# Patient Record
Sex: Female | Born: 1990 | Hispanic: Yes | Marital: Single | Smoking: Never smoker
Health system: Southern US, Community
[De-identification: ages and names within clinical notes are randomized; demographics above are authoritative.]

## PROBLEM LIST (undated history)

## (undated) DIAGNOSIS — Z789 Other specified health status: Secondary | ICD-10-CM

## (undated) HISTORY — PX: NO PAST SURGERIES: SHX2092

---

## 2020-03-25 ENCOUNTER — Encounter (HOSPITAL_COMMUNITY): Payer: Self-pay | Admitting: Obstetrics and Gynecology

## 2020-03-25 ENCOUNTER — Other Ambulatory Visit: Payer: Self-pay

## 2020-03-25 ENCOUNTER — Inpatient Hospital Stay (HOSPITAL_BASED_OUTPATIENT_CLINIC_OR_DEPARTMENT_OTHER): Payer: Medicaid - Out of State

## 2020-03-25 ENCOUNTER — Inpatient Hospital Stay (HOSPITAL_COMMUNITY)
Admission: AD | Admit: 2020-03-25 | Discharge: 2020-03-25 | Disposition: A | Discharge status: Home / Self Care | Payer: Medicaid - Out of State | Attending: Obstetrics and Gynecology | Admitting: Obstetrics and Gynecology

## 2020-03-25 DIAGNOSIS — Z3A16 16 weeks gestation of pregnancy: Secondary | ICD-10-CM

## 2020-03-25 DIAGNOSIS — O4692 Antepartum hemorrhage, unspecified, second trimester: Secondary | ICD-10-CM | POA: Diagnosis not present

## 2020-03-25 DIAGNOSIS — O26892 Other specified pregnancy related conditions, second trimester: Secondary | ICD-10-CM | POA: Insufficient documentation

## 2020-03-25 DIAGNOSIS — Z0372 Encounter for suspected placental problem ruled out: Secondary | ICD-10-CM

## 2020-03-25 DIAGNOSIS — R102 Pelvic and perineal pain: Secondary | ICD-10-CM | POA: Diagnosis not present

## 2020-03-25 DIAGNOSIS — O209 Hemorrhage in early pregnancy, unspecified: Secondary | ICD-10-CM | POA: Diagnosis present

## 2020-03-25 DIAGNOSIS — O208 Other hemorrhage in early pregnancy: Secondary | ICD-10-CM | POA: Diagnosis not present

## 2020-03-25 DIAGNOSIS — Z6790 Unspecified blood type, Rh positive: Secondary | ICD-10-CM | POA: Diagnosis not present

## 2020-03-25 HISTORY — DX: Other specified health status: Z78.9

## 2020-03-25 LAB — CBC
HCT: 33.3 % — ABNORMAL LOW (ref 36.0–46.0)
Hemoglobin: 11.2 g/dL — ABNORMAL LOW (ref 12.0–15.0)
MCH: 28.9 pg (ref 26.0–34.0)
MCHC: 33.6 g/dL (ref 30.0–36.0)
MCV: 85.8 fL (ref 80.0–100.0)
Platelets: 282 10*3/uL (ref 150–400)
RBC: 3.88 MIL/uL (ref 3.87–5.11)
RDW: 12.9 % (ref 11.5–15.5)
WBC: 9 10*3/uL (ref 4.0–10.5)
nRBC: 0 % (ref 0.0–0.2)

## 2020-03-25 LAB — URINALYSIS, ROUTINE W REFLEX MICROSCOPIC
Bilirubin Urine: NEGATIVE
Glucose, UA: NEGATIVE mg/dL
Ketones, ur: NEGATIVE mg/dL
Leukocytes,Ua: NEGATIVE
Nitrite: NEGATIVE
Protein, ur: NEGATIVE mg/dL
Specific Gravity, Urine: 1.01 (ref 1.005–1.030)
pH: 8 (ref 5.0–8.0)

## 2020-03-25 LAB — WET PREP, GENITAL
Clue Cells Wet Prep HPF POC: NONE SEEN
Sperm: NONE SEEN
Trich, Wet Prep: NONE SEEN
Yeast Wet Prep HPF POC: NONE SEEN

## 2020-03-25 LAB — POCT PREGNANCY, URINE: Preg Test, Ur: POSITIVE — AB

## 2020-03-25 LAB — ABO/RH: ABO/RH(D): A POS

## 2020-03-25 NOTE — Discharge Instructions (Signed)
Vaginal Bleeding During Pregnancy, Second Trimester  A small amount of bleeding (spotting) from the vagina is common during pregnancy. Sometimes the bleeding is normal and is not a sign of problems. In some other cases, it is a sign of something serious. Tell your doctor right away if there is any bleeding from your vagina. Follow these instructions at home: Activity  Follow your doctor's instructions about how active you can be.  If needed, make plans for someone to help with your normal activities.  Do not exercise or do activities that take a lot of effort until your doctor says that this is safe.  Do not lift anything that is heavier than 10 lb (4.5 kg) until your doctor says that this is safe.  Do not have sex or orgasms until your doctor says that this is safe. Medicines  Take over-the-counter and prescription medicines only as told by your doctor.  Do not take aspirin. It can cause bleeding. General instructions  Watch your condition for any changes.  Write down: ? The number of pads you use each day. ? How often you change pads. ? How soaked your pads are.  Do not use tampons.  Do not douche.  If you pass any tissue from your vagina, save it to show to your doctor.  Keep all follow-up visits as told by your doctor. This is important. Contact a doctor if:  You have bleeding in the vagina at any time during pregnancy.  You have cramps.  You have a fever that does not get better with medicine. Get help right away if:  You have very bad cramps in your back or belly (abdomen).  You have contractions.  You have chills.  You pass large clots or a lot of tissue from your vagina.  Your bleeding gets worse.  You feel light-headed.  You feel weak.  You pass out (faint).  You are leaking fluid from your vagina.  You have a gush of fluid from your vagina. Summary  Sometimes vaginal bleeding during pregnancy is normal and is not a problem. Sometimes it may  be a sign of something serious.  Tell your doctor about any bleeding from your vagina right away.  Follow your doctor's instructions about how active you can be. You may need someone to help you with your normal activities. This information is not intended to replace advice given to you by your health care provider. Make sure you discuss any questions you have with your health care provider. Document Revised: 07/06/2018 Document Reviewed: 06/18/2016 Elsevier Patient Education  2020 [employer]  

## 2020-03-25 NOTE — MAU Provider Note (Signed)
History     CSN: [id number]  Arrival date and time: 03/25/20 0901   Event Date/Time   First Provider Initiated Contact with Patient 03/25/20 1001      Chief Complaint  Patient presents with  . Pelvic Pain  . Vaginal Bleeding   29 y.o. [id number] @16 .2 wks presenting with VB. Reports onset around 4am and again at 7am. Describes as dark red when she wipes and some in the toilet. Reports onset of low abdominal cramping around 8pm last night. Rates pain 1/10. Has not tried any treatments. No recent sex. She is getting care in 3/10 and reports no complications. Had a normal [place] last month.   OB History    Gravida  3   Para  2   Term  2   Preterm      AB      Living  2     SAB      IAB      Ectopic      Multiple      Live Births              Past Medical History:  Diagnosis Date  . Medical history non-contributory     Past Surgical History:  Procedure Laterality Date  . NO PAST SURGERIES      Family History  Problem Relation Age of Onset  . Hypertension Mother   . Diabetes Mother     Social History   Tobacco Use  . Smoking status: Never Smoker  . Smokeless tobacco: Never Used  Substance Use Topics  . Alcohol use: Never  . Drug use: Never    Allergies: No Known Allergies  No medications prior to admission.    Review of Systems  Gastrointestinal: Positive for abdominal pain.  Genitourinary: Positive for vaginal bleeding.   Physical Exam   Blood pressure 117/82, pulse 94, temperature 98.2 F (36.8 C), temperature source Oral, resp. rate 16, height 4\' 11"  (1.499 m), weight 57.7 kg, SpO2 100 %.  Physical Exam Vitals and nursing note reviewed.  Constitutional:      General: She is not in acute distress.    Appearance: Normal appearance.  HENT:     Head: Normocephalic and atraumatic.  Cardiovascular:     Rate and Rhythm: Normal rate.  Pulmonary:     Effort: Pulmonary effort is normal. No respiratory distress.  Abdominal:     General:  There is no distension.     Palpations: Abdomen is soft.     Tenderness: There is no abdominal tenderness. There is no guarding or rebound.  Genitourinary:    Comments: External: no lesions or erythema Vagina: rugated, pink, moist, small amt dark red bloody discharge Cervix closed/thick  Musculoskeletal:        General: Normal range of motion.     Cervical back: Normal range of motion.  Skin:    General: Skin is warm and dry.  Neurological:     General: No focal deficit present.     Mental Status: She is alert and oriented to person, place, and time.  Psychiatric:        Mood and Affect: Mood normal.        Behavior: Behavior normal.   FHT by doppler 145  Results for orders placed or performed during the hospital encounter of 03/25/20 (from the past 24 hour(s))  Pregnancy, urine POC     Status: Abnormal   Collection Time: 03/25/20  9:26 AM  Result Value Ref Range  Preg Test, Ur POSITIVE (A) NEGATIVE  Urinalysis, Routine w reflex microscopic Urine, Clean Catch     Status: Abnormal   Collection Time: 03/25/20  9:40 AM  Result Value Ref Range   Color, Urine YELLOW YELLOW   APPearance HAZY (A) CLEAR   Specific Gravity, Urine 1.010 1.005 - 1.030   pH 8.0 5.0 - 8.0   Glucose, UA NEGATIVE NEGATIVE mg/dL   Hgb urine dipstick MODERATE (A) NEGATIVE   Bilirubin Urine NEGATIVE NEGATIVE   Ketones, ur NEGATIVE NEGATIVE mg/dL   Protein, ur NEGATIVE NEGATIVE mg/dL   Nitrite NEGATIVE NEGATIVE   Leukocytes,Ua NEGATIVE NEGATIVE   RBC / HPF 0-5 0 - 5 RBC/hpf   WBC, UA 0-5 0 - 5 WBC/hpf   Bacteria, UA RARE (A) NONE SEEN   Squamous Epithelial / LPF 0-5 0 - 5  CBC     Status: Abnormal   Collection Time: 03/25/20 10:06 AM  Result Value Ref Range   WBC 9.0 4.0 - 10.5 K/uL   RBC 3.88 3.87 - 5.11 MIL/uL   Hemoglobin 11.2 (L) 12.0 - 15.0 g/dL   HCT 50.0 (L) 93.8 - 18.2 %   MCV 85.8 80.0 - 100.0 fL   MCH 28.9 26.0 - 34.0 pg   MCHC 33.6 30.0 - 36.0 g/dL   RDW 99.3 71.6 - 96.7 %   Platelets  282 150 - 400 K/uL   nRBC 0.0 0.0 - 0.2 %  ABO/Rh     Status: None   Collection Time: 03/25/20 10:06 AM  Result Value Ref Range   ABO/RH(D)      A POS Performed at [facility], [address], [place], [place] [address]   Wet prep, genital     Status: Abnormal   Collection Time: 03/25/20 10:19 AM  Result Value Ref Range   Yeast Wet Prep HPF POC NONE SEEN NONE SEEN   Trich, Wet Prep NONE SEEN NONE SEEN   Clue Cells Wet Prep HPF POC NONE SEEN NONE SEEN   WBC, Wet Prep HPF POC MANY (A) NONE SEEN   Sperm NONE SEEN    [place]: prelim> SCH near placental edge in LUS  MAU Course  Procedures  MDM Labs and [place] ordered and reviewed. Discussed findings with pt. Bleeding precautions discussed. Stable for discharge home.   Assessment and Plan   1. [redacted] weeks gestation of pregnancy   2. Vaginal bleeding in pregnancy, second trimester   3. Blood type, Rh positive    Discharge home Follow up with OB provider in [place] next week as scheduled Pelvic rest Bleeding/SAB precautions  Allergies as of 03/25/2020   No Known Allergies     Medication List    You have not been prescribed any medications.    Donette Larry, CNM 03/25/2020, 11:42 AM

## 2020-03-25 NOTE — MAU Note (Signed)
Nemesis Rainwater is a 29 y.o. at [redacted]w[redacted]d here in MAU reporting: vaginal bleeding starting at 0400, states it was more than spotting but not very heavy. States she has seen a small amount on a pad, no blood clots. Denies recent IC. Having a little bit of pelvic pain.   Pt reports she started [facility] in [place] due to insurance and is going back to [place], states she has a doctors office there and has been approved for [facility].  Onset of complaint: today  Pain score: 1/10  Vitals:   03/25/20 0931 03/25/20 0949  BP: 113/69 117/82  Pulse: 87 94  Resp: 16   Temp: 98.2 F (36.8 C)   SpO2: 100%      FHT:145  Lab orders placed from triage: UA, UPT

## 2020-03-26 LAB — GC/CHLAMYDIA PROBE AMP (~~LOC~~) NOT AT ARMC
Chlamydia: NEGATIVE
Comment: NEGATIVE
Comment: NORMAL
Neisseria Gonorrhea: NEGATIVE

## 2021-06-26 IMAGING — US US MFM OB LIMITED
1 series · 14 of 28 positions shown · non-contrast
Comparison: none

[Series 1: us mfm ob limited · 76 acquisitions, 14 frames shown]
[im 3/76]
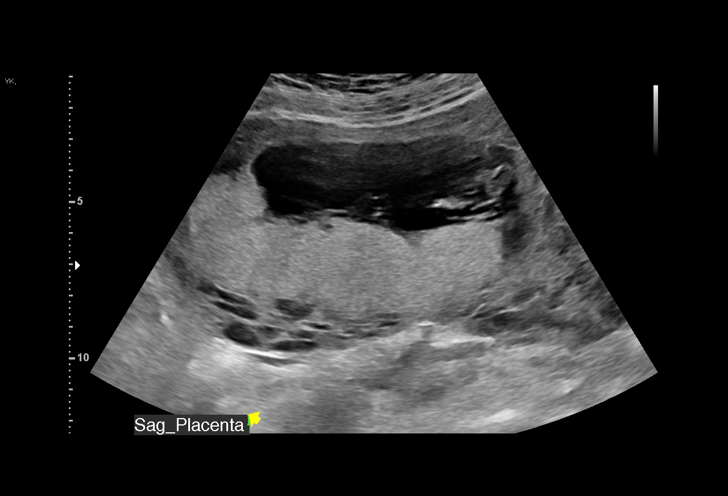
[im 9/76]
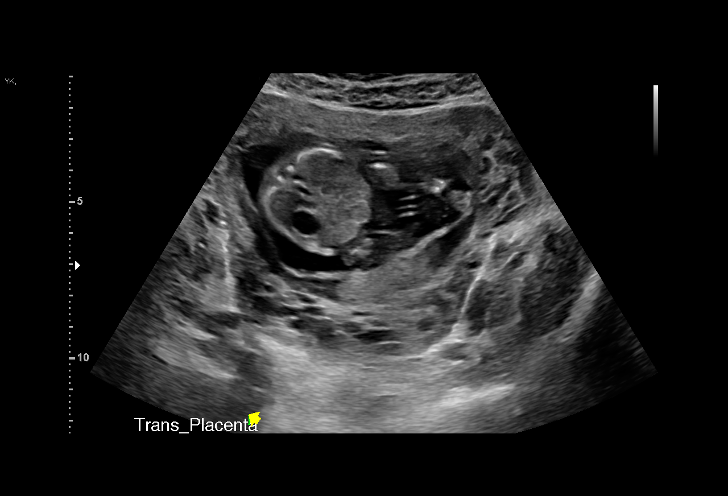
[im 14/76]
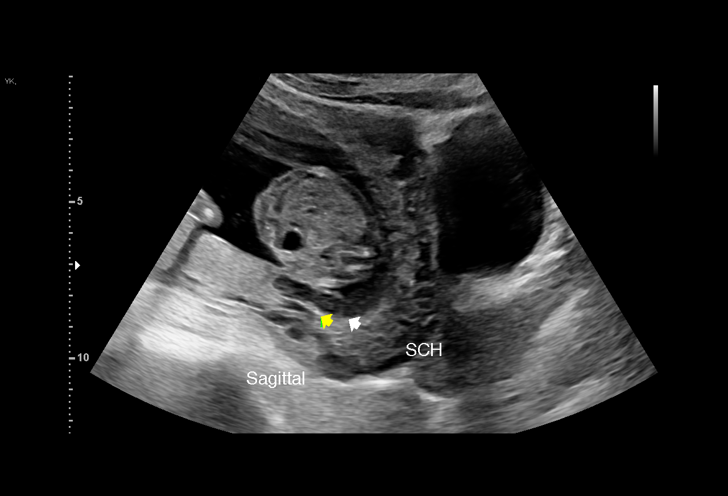
[im 20/76]
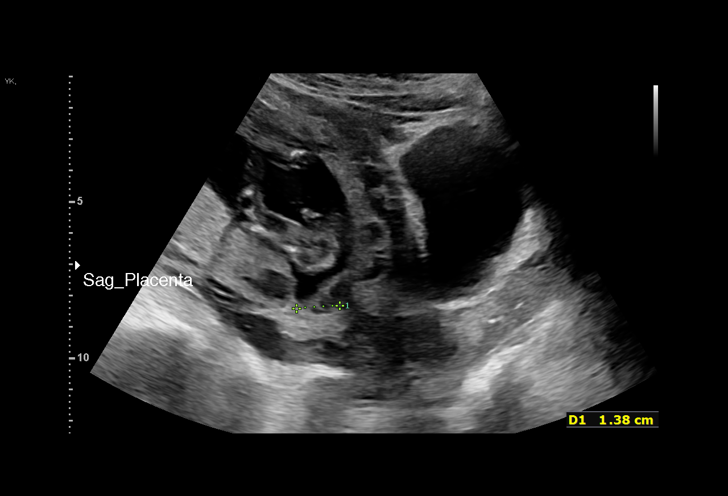
[im 26/76]
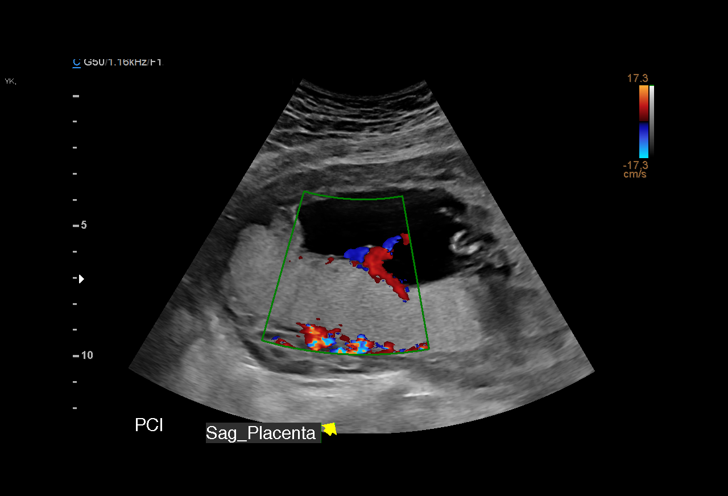
[im 31/76]
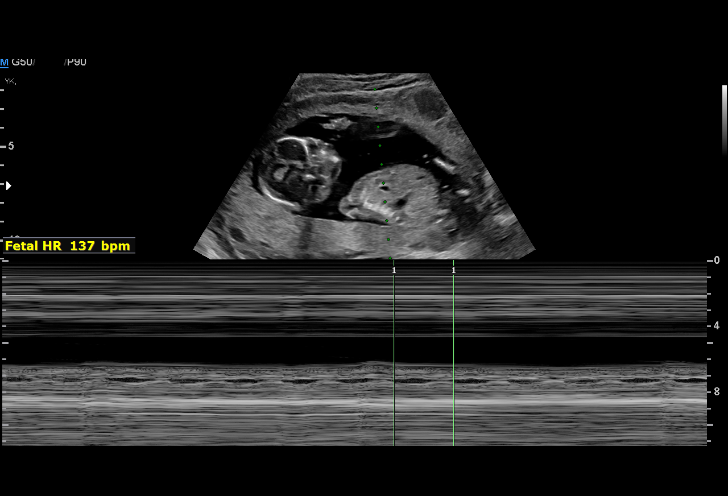
[im 37/76]
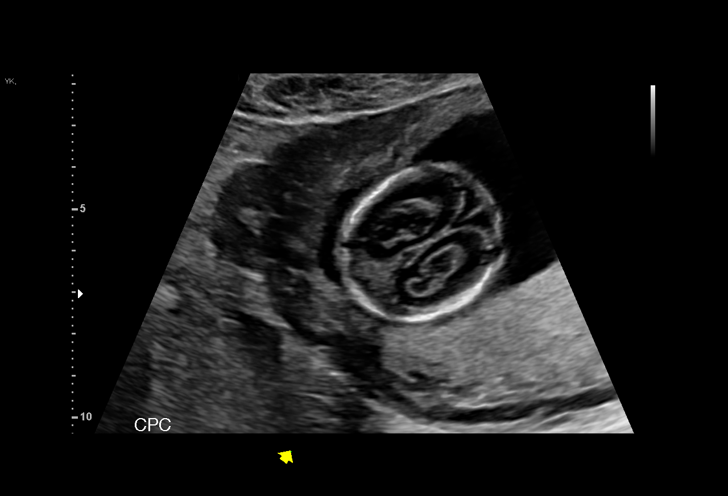
[im 42/76]
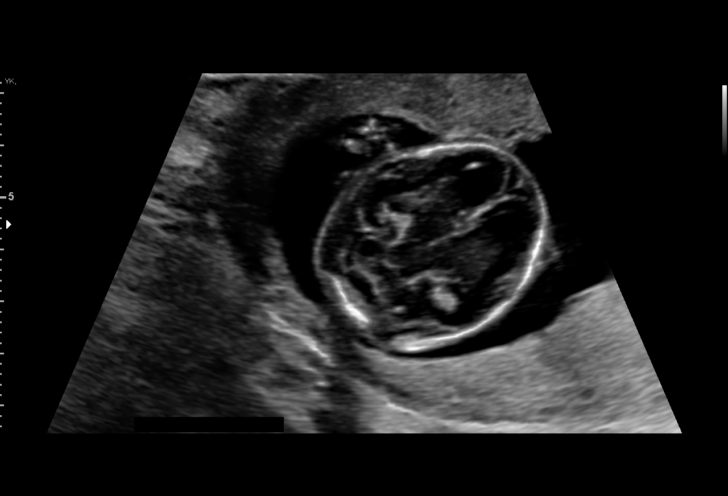
[im 48/76]
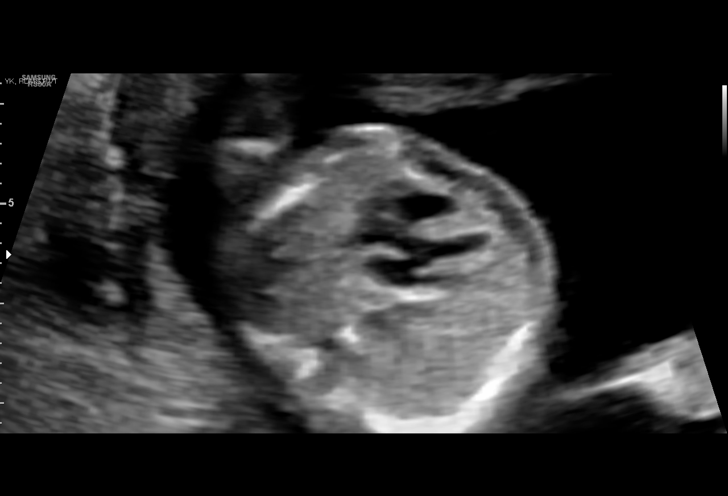
[im 53/76]
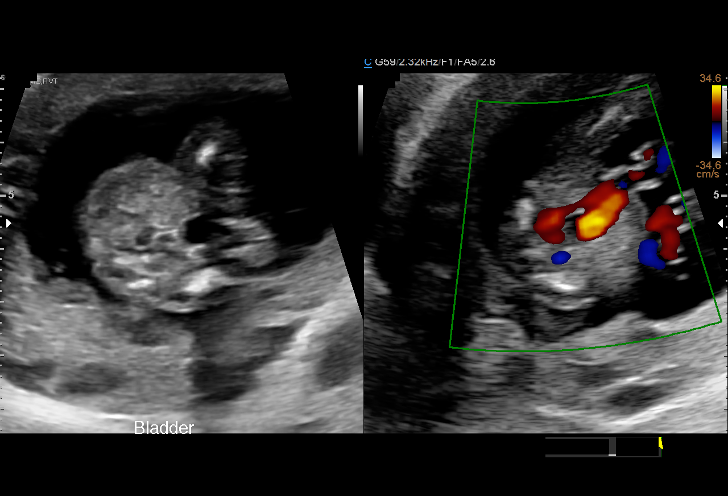
[im 59/76]
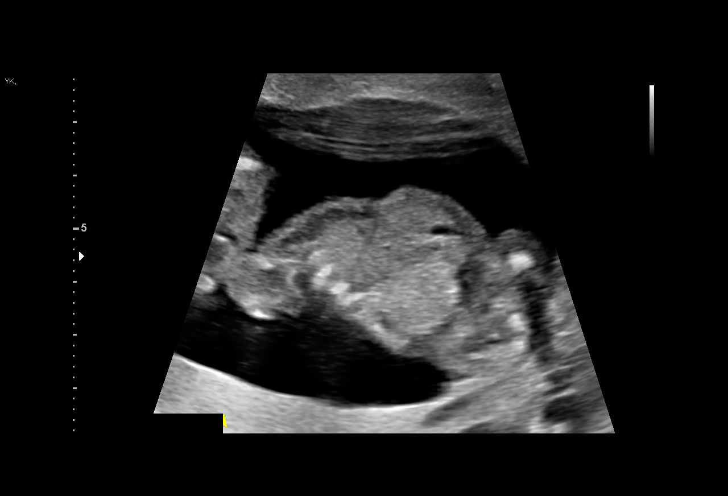
[im 64/76]
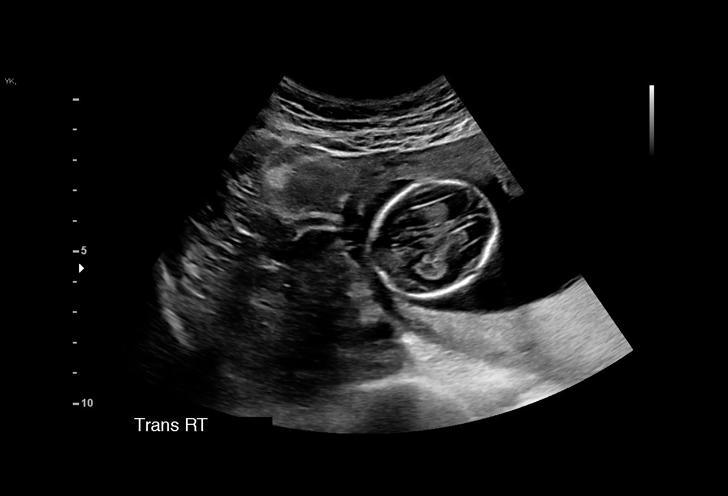
[im 70/76]
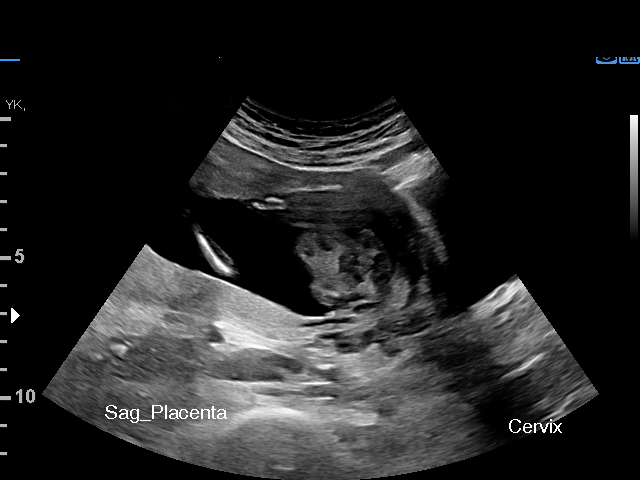
[im 76/76]
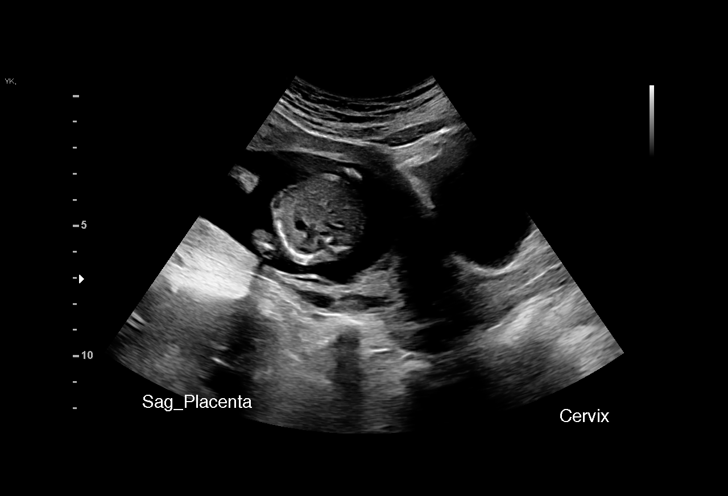

[14 of 28 positions shown; findings below may reference images not displayed]

Referred By:      ILJEN TRANA              [facility]
                   [facility]                               [HOSPITAL]

 1  US MFM OB LIMITED                     76815.01    MANUELLA TIGER

Indications

 Vaginal bleeding in pregnancy, second
 trimester
 Encounter for cervical length
 16 weeks gestation of pregnancy
 Encounter for antenatal screening, Placental
 location
Fetal Evaluation

 Num Of Fetuses:          1
 Fetal Heart Rate(bpm):   148
 Cardiac Activity:        Observed
 Presentation:            Breech
 Placenta:                Posterior
 P. Cord Insertion:       Visualized, central

 Amniotic Fluid
 AFI FV:      Within normal limits

                             Largest Pocket(cm)


 Comment:    Small subchorionic hemorrhage noted in the lower segment of
             uterus (around cervical internal os and posterior wall near
             placental inferior edge).
OB History

 Gravidity:    3         Term:   2
 Living:       2
Gestational Age

 LMP:           16w 2d        Date:  12/02/19                 EDD:   09/07/20
 Best:          16w 2d     Det. By:  LMP  (12/02/19)          EDD:   09/07/20
Anatomy

 Cranium:               Appears normal         Diaphragm:              Appears normal
 Cavum:                 Appears normal         Stomach:                Appears normal, left
                                                                       sided
 Choroid Plexus:        Bilateral choroid      Abdomen:                Appears normal
                        plexus cysts
 Cerebellum:            Appears normal         Abdominal Wall:         Appears nml (cord
                                                                       insert, abd wall)
 Posterior Fossa:       Appears normal         Cord Vessels:           Appears normal (3
                                                                       vessel cord)
 Face:                  Profile appears        Kidneys:                Appear normal
                        normal
 Heart:                 Appears normal         Bladder:                Appears normal
                        (4CH, axis, and
                        situs)
Cervix Uterus Adnexa

 Cervix
 Length:            3.2  cm.
 Normal appearance by transabdominal scan.

 Uterus
 No abnormality visualized.

 Right Ovary
 Within normal limits.

 Left Ovary
 Within normal limits.

 Cul De Sac
 No free fluid seen.

 Adnexa
 No abnormality visualized.
Impression

 Limited exam to assess vaginal bleeding

 There is subchorionic hemorrhage

 Isolated bilateral choroid plexus cyst noted.

 There is good fetal movement and amniotic fluid
 There is no evidence of placental previa or abruption
Recommendations

 Follow up as clinically indicated.
